# Patient Record
Sex: Male | Born: 1955 | Hispanic: No | Marital: Married
Health system: Southern US, Community
[De-identification: ages and names within clinical notes are randomized; demographics above are authoritative.]

---

## 2006-12-18 ENCOUNTER — Other Ambulatory Visit: Payer: Self-pay

## 2006-12-18 ENCOUNTER — Inpatient Hospital Stay: Payer: Self-pay | Admitting: Internal Medicine

## 2006-12-29 ENCOUNTER — Ambulatory Visit: Payer: Self-pay | Admitting: Internal Medicine

## 2006-12-31 ENCOUNTER — Other Ambulatory Visit: Payer: Self-pay

## 2006-12-31 ENCOUNTER — Inpatient Hospital Stay: Payer: Self-pay | Admitting: Internal Medicine

## 2007-01-19 ENCOUNTER — Ambulatory Visit: Payer: Self-pay | Admitting: Internal Medicine

## 2007-01-27 ENCOUNTER — Other Ambulatory Visit: Payer: Self-pay

## 2007-01-27 ENCOUNTER — Inpatient Hospital Stay: Payer: Self-pay | Admitting: Internal Medicine

## 2007-01-29 ENCOUNTER — Ambulatory Visit: Payer: Self-pay | Admitting: Internal Medicine

## 2007-02-01 ENCOUNTER — Ambulatory Visit: Payer: Self-pay | Admitting: Internal Medicine

## 2007-02-09 ENCOUNTER — Inpatient Hospital Stay: Payer: Self-pay | Admitting: Internal Medicine

## 2007-02-09 ENCOUNTER — Other Ambulatory Visit: Payer: Self-pay

## 2007-02-13 ENCOUNTER — Other Ambulatory Visit: Payer: Self-pay

## 2007-02-14 ENCOUNTER — Other Ambulatory Visit: Payer: Self-pay

## 2007-02-28 ENCOUNTER — Ambulatory Visit: Payer: Self-pay | Admitting: Internal Medicine

## 2007-03-14 ENCOUNTER — Inpatient Hospital Stay: Payer: Self-pay | Admitting: Internal Medicine

## 2007-03-14 ENCOUNTER — Other Ambulatory Visit: Payer: Self-pay

## 2007-03-31 ENCOUNTER — Ambulatory Visit: Payer: Self-pay | Admitting: Internal Medicine

## 2007-04-05 ENCOUNTER — Ambulatory Visit: Payer: Self-pay | Admitting: Internal Medicine

## 2007-05-01 ENCOUNTER — Ambulatory Visit: Payer: Self-pay | Admitting: Internal Medicine

## 2007-05-29 ENCOUNTER — Ambulatory Visit: Payer: Self-pay | Admitting: Internal Medicine

## 2007-06-29 ENCOUNTER — Ambulatory Visit: Payer: Self-pay | Admitting: Internal Medicine

## 2007-07-29 ENCOUNTER — Ambulatory Visit: Payer: Self-pay | Admitting: Internal Medicine

## 2007-08-18 ENCOUNTER — Ambulatory Visit: Payer: Self-pay | Admitting: Internal Medicine

## 2007-08-29 ENCOUNTER — Ambulatory Visit: Payer: Self-pay | Admitting: Internal Medicine

## 2007-09-28 ENCOUNTER — Ambulatory Visit: Payer: Self-pay | Admitting: Internal Medicine

## 2007-10-20 ENCOUNTER — Ambulatory Visit: Payer: Self-pay | Admitting: Internal Medicine

## 2007-10-29 ENCOUNTER — Ambulatory Visit: Payer: Self-pay | Admitting: Internal Medicine

## 2007-11-29 ENCOUNTER — Ambulatory Visit: Payer: Self-pay | Admitting: Internal Medicine

## 2007-12-13 ENCOUNTER — Ambulatory Visit: Payer: Self-pay | Admitting: Internal Medicine

## 2007-12-29 ENCOUNTER — Ambulatory Visit: Payer: Self-pay | Admitting: Internal Medicine

## 2008-01-21 ENCOUNTER — Inpatient Hospital Stay: Payer: Self-pay | Admitting: Internal Medicine

## 2008-02-08 ENCOUNTER — Ambulatory Visit (HOSPITAL_COMMUNITY): Admission: RE | Admit: 2008-02-08 | Discharge: 2008-02-08 | Payer: Self-pay | Admitting: Internal Medicine

## 2008-06-14 ENCOUNTER — Emergency Department: Payer: Self-pay | Admitting: Unknown Physician Specialty

## 2009-05-15 ENCOUNTER — Emergency Department: Payer: Self-pay | Admitting: Internal Medicine

## 2009-08-11 ENCOUNTER — Inpatient Hospital Stay: Payer: Self-pay | Admitting: Specialist

## 2009-08-12 ENCOUNTER — Ambulatory Visit: Payer: Self-pay | Admitting: Cardiovascular Disease

## 2009-12-21 IMAGING — CR DG CHEST 1V PORT
1 series · 1 of 1 positions shown · non-contrast
Comparison: none

REASON FOR EXAM: central line placement     rm 3
COMMENTS:

[view not recorded]
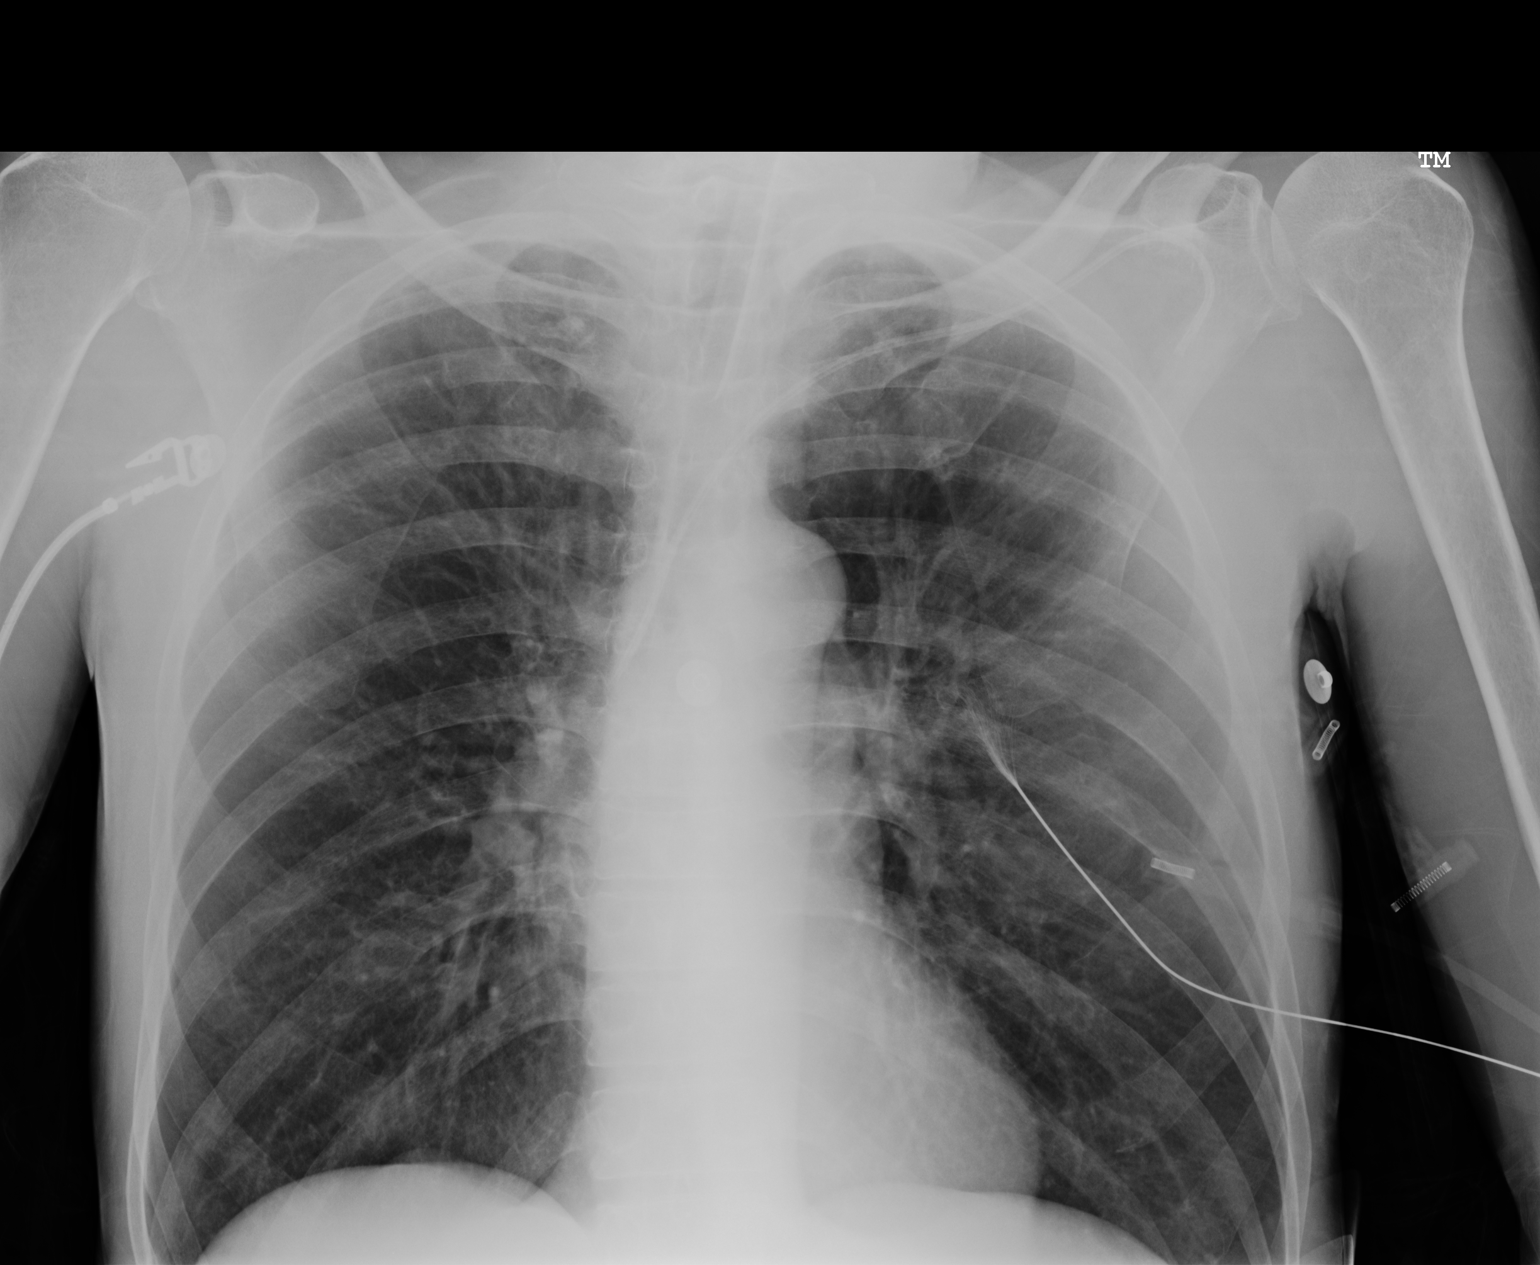

[1 of 1 positions shown; findings below may reference images not displayed]

PROCEDURE:     DXR - DXR PORTABLE CHEST SINGLE VIEW  - January 21, 2008  [DATE]

RESULT:     Comparison is made to a previous examination of 01/21/2008.

Since the previous examination a LEFT subclavian central venous catheter has
been placed. The tip is in the superior vena cava and appears to be in good
position. There is no evidence of pneumothorax. The endotracheal tube tip is
at the level of the sternoclavicular joints. There is no significant
interval change otherwise.
IMPRESSION: 1.     Interval placement of a LEFT subclavian central venous catheter
without evidence of complication.

## 2009-12-21 IMAGING — CR DG CHEST 1V PORT
1 series · 1 of 1 positions shown · non-contrast
Comparison: none

REASON FOR EXAM: tube placement       rm 3
COMMENTS:

[view not recorded]
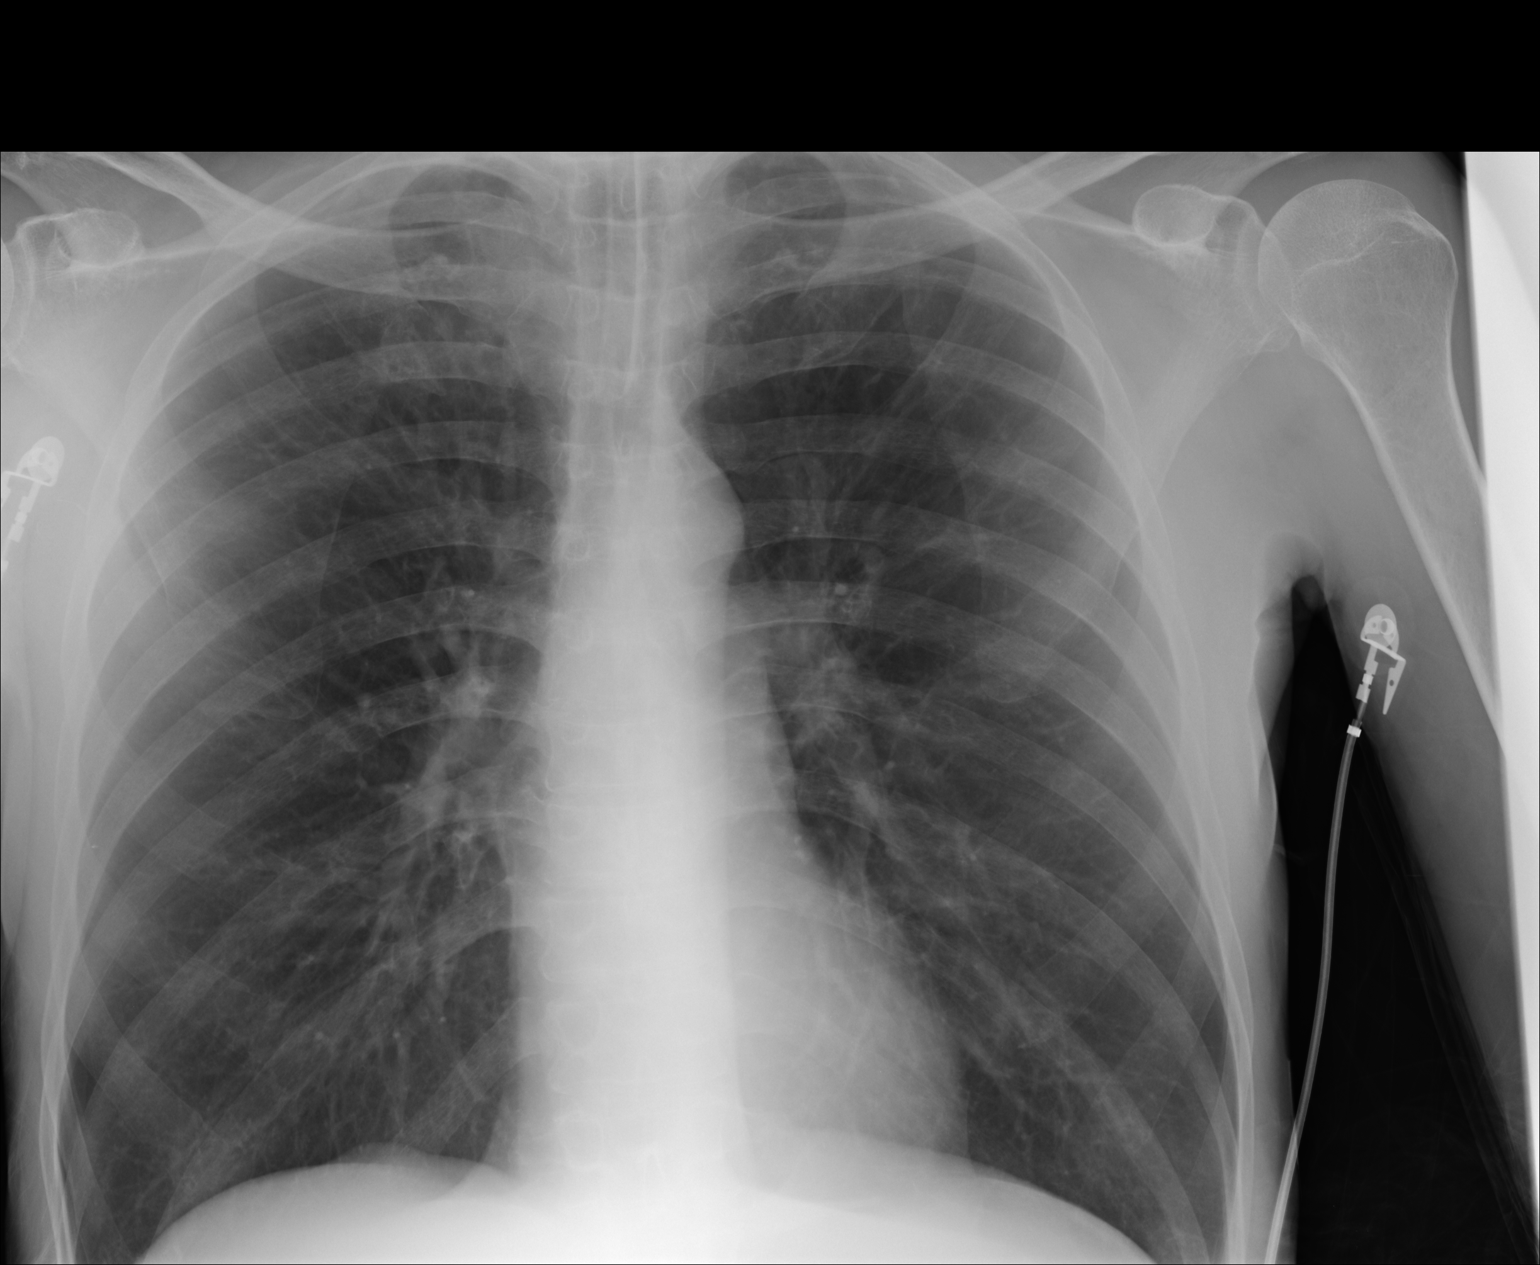

[1 of 1 positions shown; findings below may reference images not displayed]

PROCEDURE:     DXR - DXR PORTABLE CHEST SINGLE VIEW  - January 21, 2008  [DATE]

RESULT:     Comparison is made to the earlier film of 01/21/2008 at [DATE]. The patient has been intubated. The tip of the endotracheal tube appears
to be at the level of the aortic arch and origin of the great vessels. The
lungs are hyperinflated consistent with COPD. There is no infiltrate or
effusion. There is no pneumothorax. The costophrenic angles are not included
on the film.
IMPRESSION: Endotracheal tube appears to be in good position. COPD
changes are noted.

## 2009-12-21 IMAGING — CR DG CHEST 1V PORT
1 series · 1 of 1 positions shown · non-contrast
Comparison: none

REASON FOR EXAM: reposition of central line       rm 3
COMMENTS:

[view not recorded]
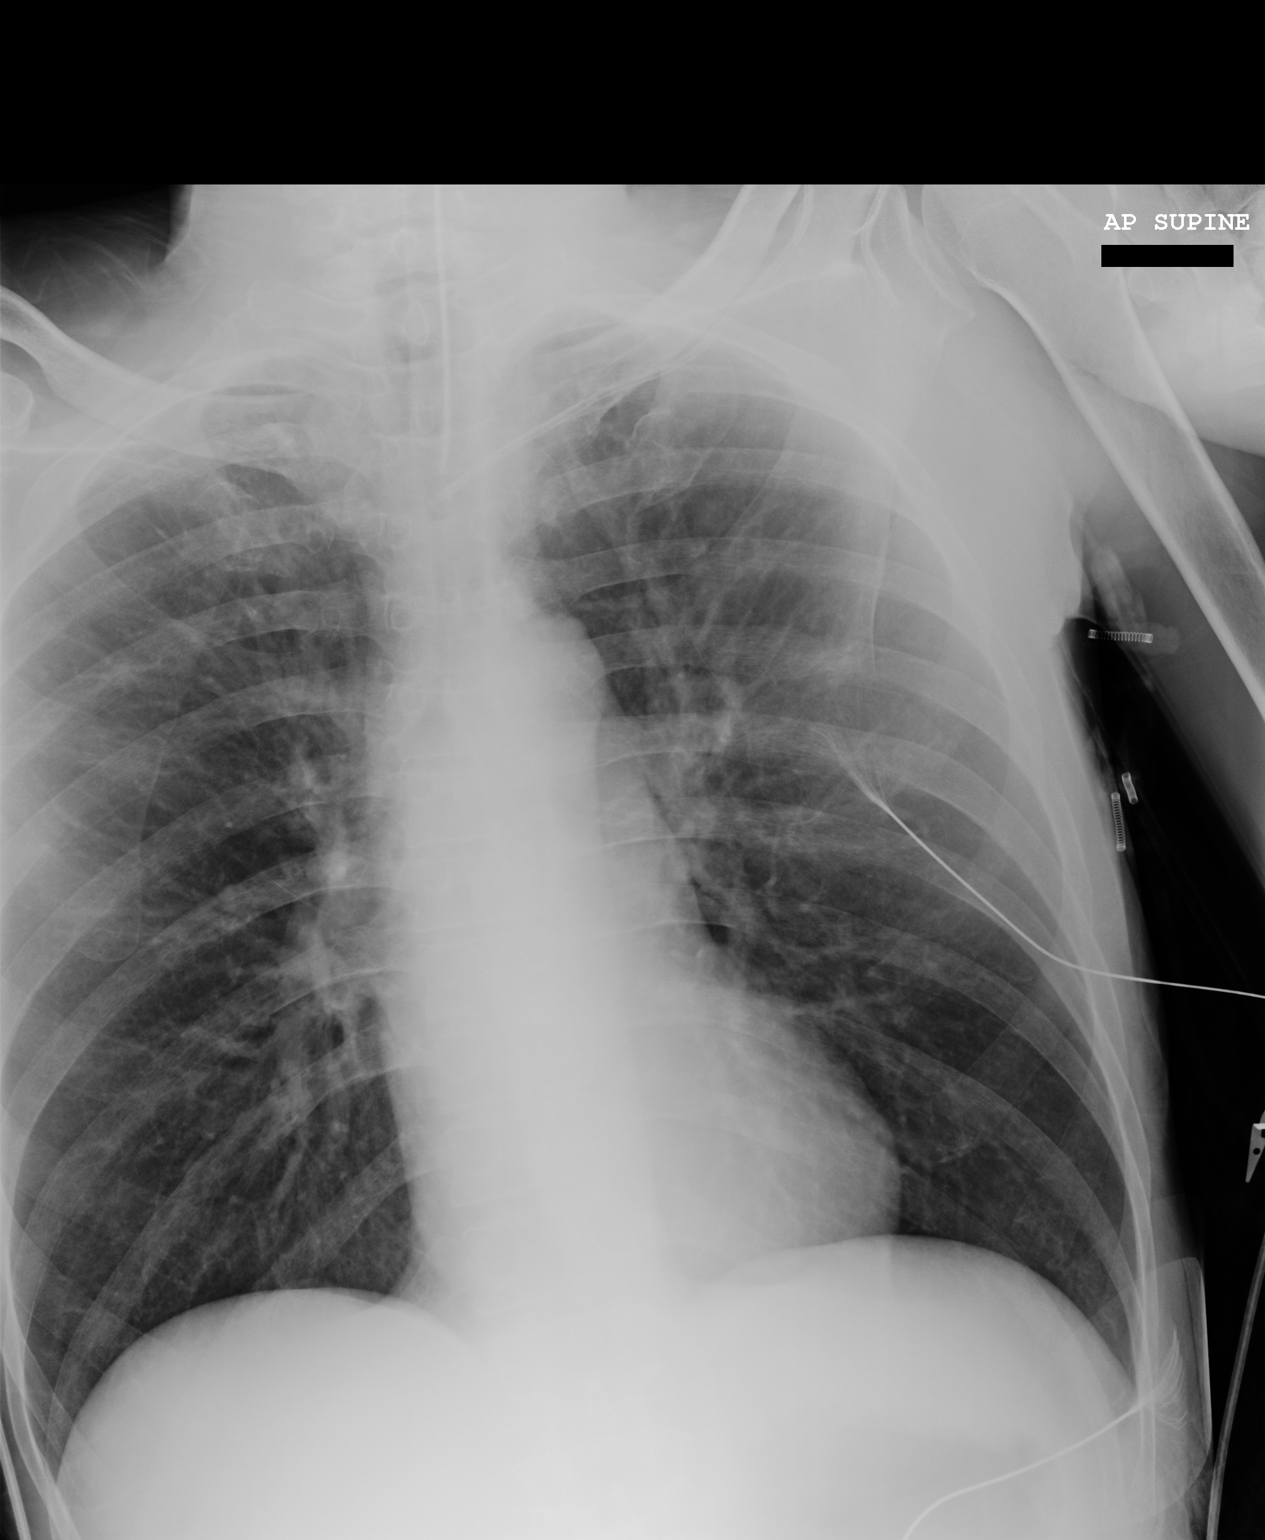

[1 of 1 positions shown; findings below may reference images not displayed]

PROCEDURE:     DXR - DXR PORTABLE CHEST SINGLE VIEW  - January 21, 2008  [DATE]

RESULT:     Comparison is made to the earlier studies of the same date. A
LEFT subclavian central venous catheter is present. The tip has been
withdrawn slightly into the LEFT brachiocephalic region. The endotracheal
tube tip remains unchanged at the level of the sternoclavicular joints. No
other significant change is evident. COPD is present.
IMPRESSION: Please see above.

## 2009-12-21 IMAGING — CT CT HEAD WITHOUT CONTRAST
2 series · 16 of 30 positions shown, 20 images · non-contrast
Comparison: none

REASON FOR EXAM: dec mental status poss seizure
COMMENTS:

[Series 2: without · axial · non-contrast · 0.48mm/px · z∈[-162,-28]mm · 13 of 33 slices shown, 17 images]
[im 3/33  brain]
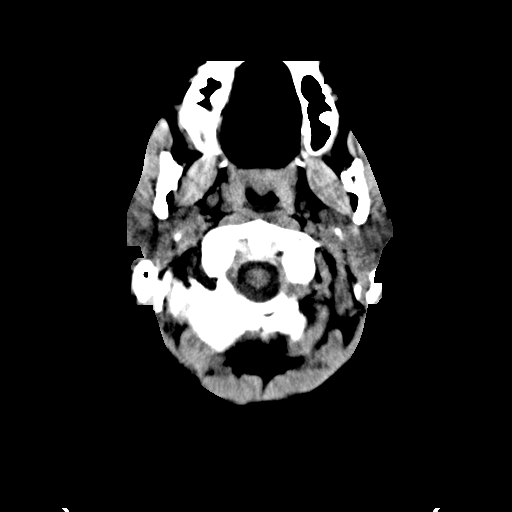
[im 3/33  bone]
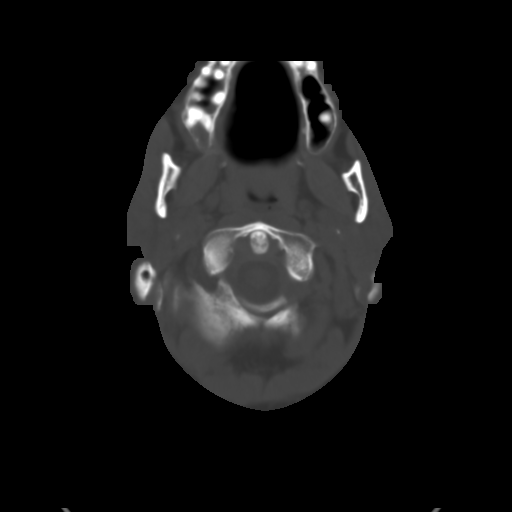
[im 5/33  brain]
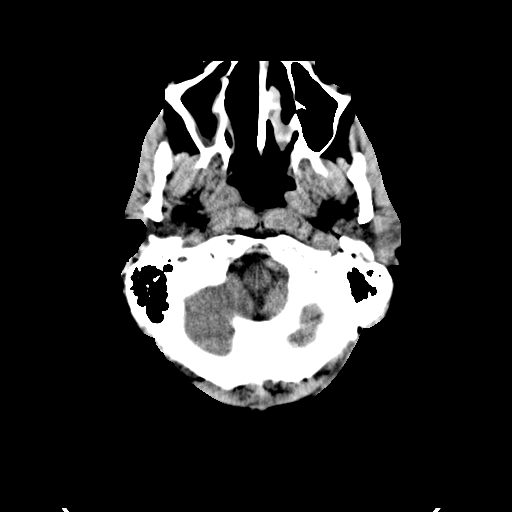
[im 7/33  brain]
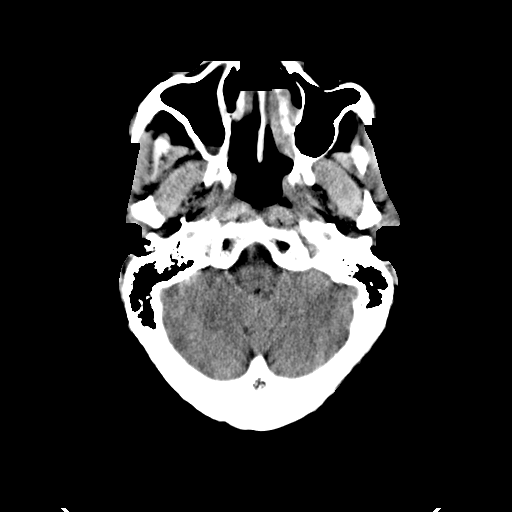
[im 10/33  brain]
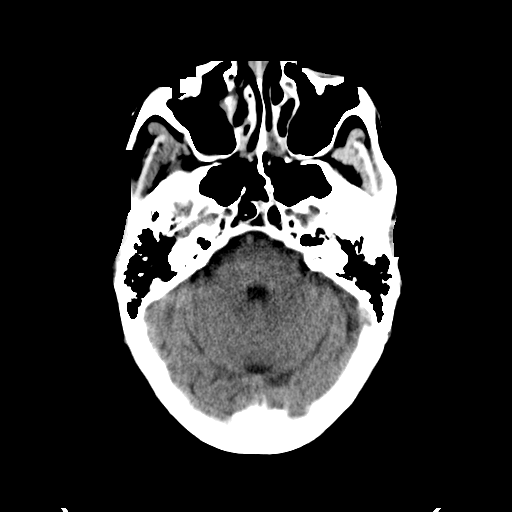
[im 12/33  brain]
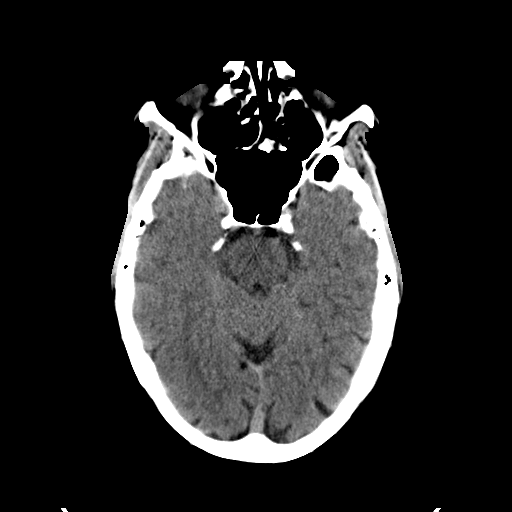
[im 12/33  bone]
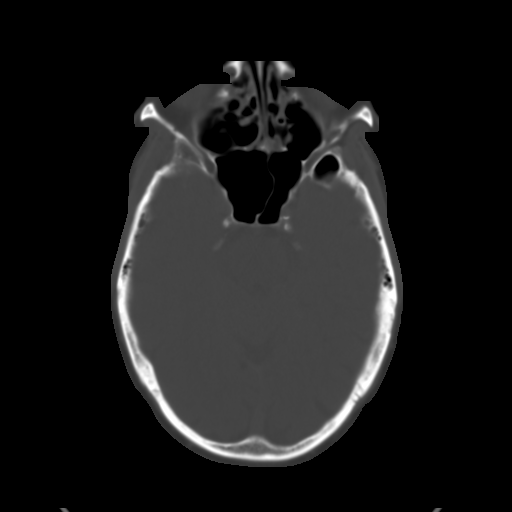
[im 14/33  brain]
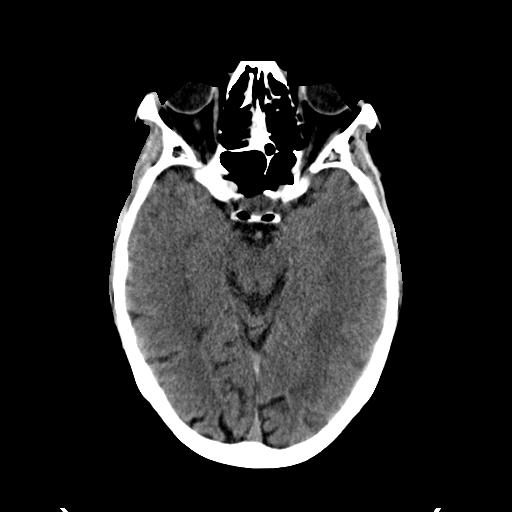
[im 17/33  brain]
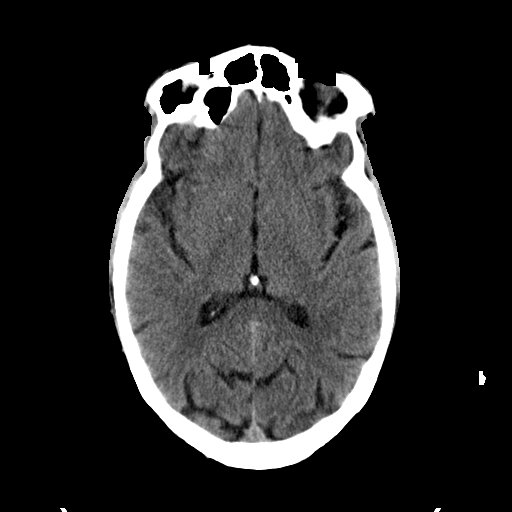
[im 19/33  brain]
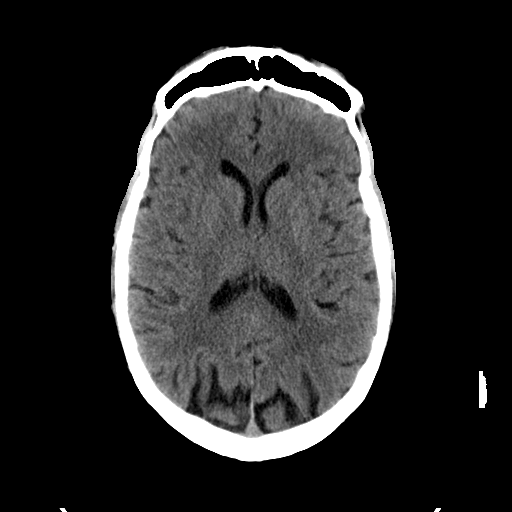
[im 21/33  brain]
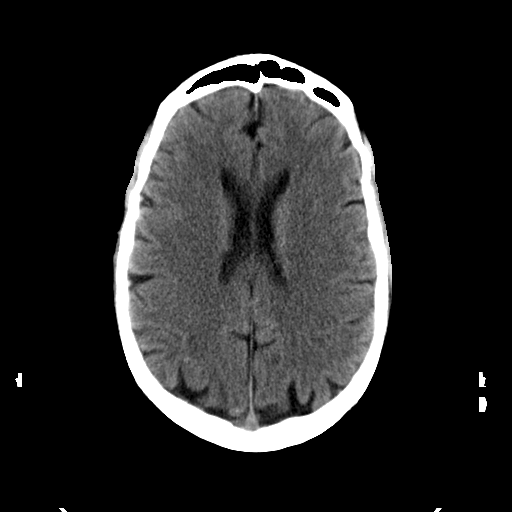
[im 21/33  bone]
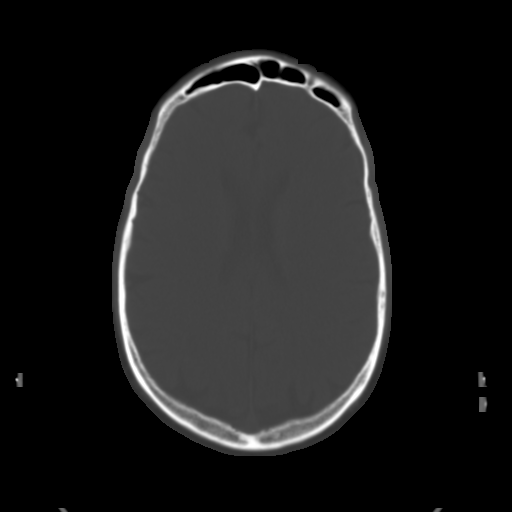
[im 23/33  brain]
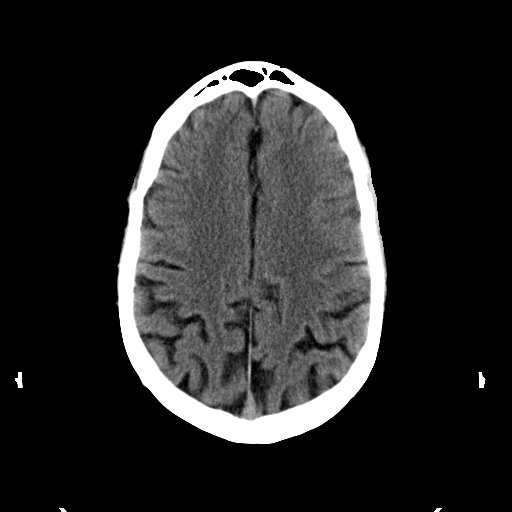
[im 26/33  brain]
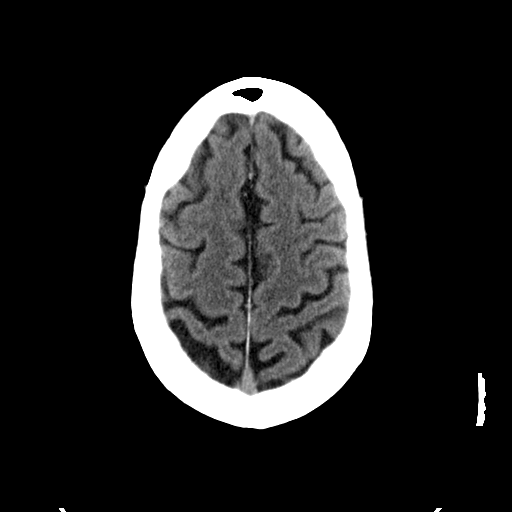
[im 28/33  brain]
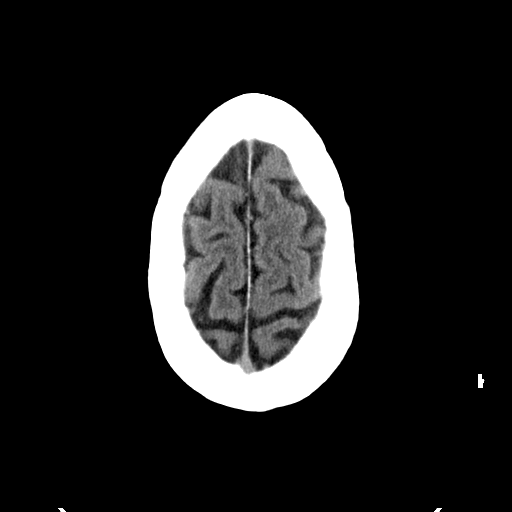
[im 30/33  brain]
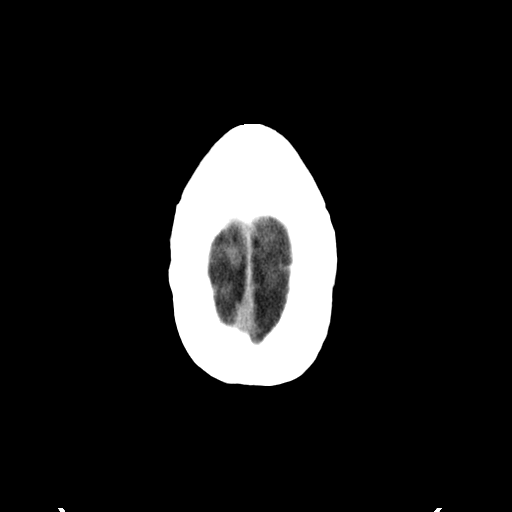
[im 30/33  bone]
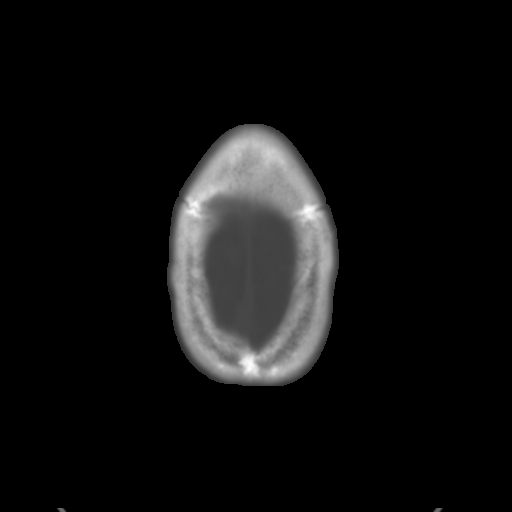

[Series 3: bone · axial · 0.48mm/px · z∈[-162,-118]mm · 3 of 33 slices shown]
[im 3/33  bone]
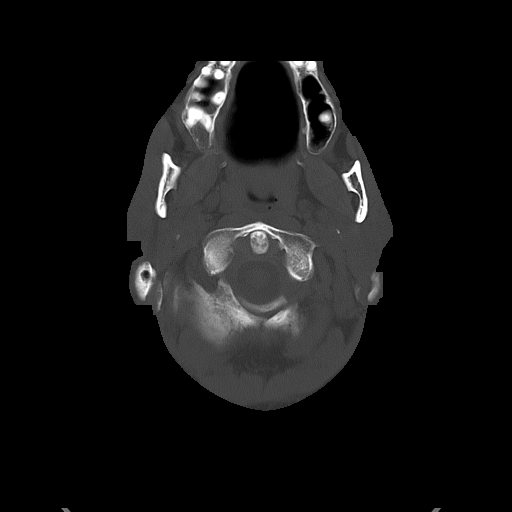
[im 7/33  bone]
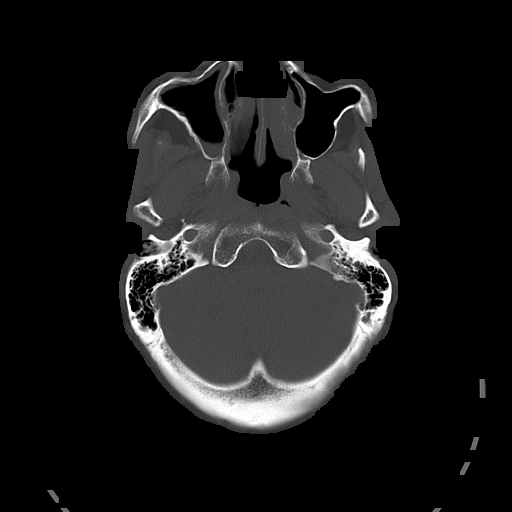
[im 12/33  bone]
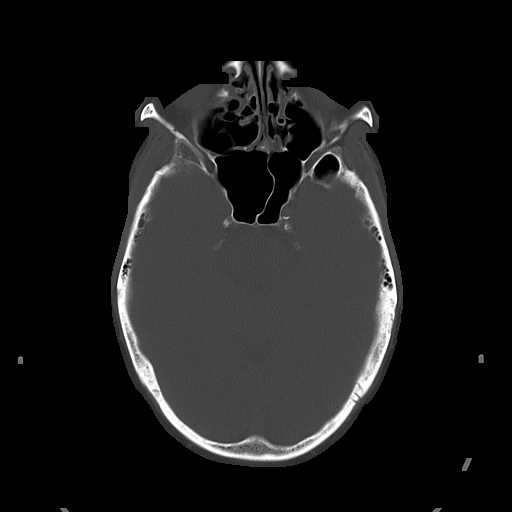

[16 of 30 positions shown; findings below may reference images not displayed]

PROCEDURE:     CT  - CT HEAD WITHOUT CONTRAST  - January 21, 2008  [DATE]

RESULT:     Non-contrast emergent CT is compared to a previous examination
of 02/09/2007.

There is mild atrophy with prominence of the ventricles and sulci. There is
no hemorrhage, mass-effect or midline shift. There is no extra-axial
hematoma. There is no large territorial infarct. An endotracheal tube
appears to be present. There is an air-fluid level in the RIGHT maxillary
sinus. There is some mucosal thickening in the RIGHT maxillary sinus as well
as in the ethmoid sinuses. The calvarium is intact.
IMPRESSION: 1.     No significant change. Mild atrophy. No hemorrhage or mass-effect.

## 2010-10-19 ENCOUNTER — Inpatient Hospital Stay: Payer: Self-pay | Admitting: Student

## 2010-12-29 ENCOUNTER — Ambulatory Visit: Payer: Self-pay | Admitting: Internal Medicine

## 2011-01-13 ENCOUNTER — Inpatient Hospital Stay: Payer: Self-pay | Admitting: Student

## 2011-01-29 ENCOUNTER — Ambulatory Visit: Payer: Self-pay | Admitting: Internal Medicine

## 2011-03-09 ENCOUNTER — Inpatient Hospital Stay: Payer: Self-pay | Admitting: Internal Medicine

## 2011-03-16 ENCOUNTER — Ambulatory Visit (HOSPITAL_COMMUNITY)
Admission: AD | Admit: 2011-03-16 | Discharge: 2011-03-16 | Disposition: A | Discharge status: Home / Self Care | Payer: Self-pay | Source: Other Acute Inpatient Hospital | Attending: Internal Medicine | Admitting: Internal Medicine

## 2011-03-16 DIAGNOSIS — J9600 Acute respiratory failure, unspecified whether with hypoxia or hypercapnia: Secondary | ICD-10-CM | POA: Insufficient documentation

## 2011-05-01 DEATH — deceased

## 2014-07-22 NOTE — Consult Note (Signed)
PATIENT NAME:  Thomas RubensteinOTEAT, Thomas Stanton MR#:  [id number] DATE OF BIRTH:  1955/06/11  DATE OF CONSULTATION:  03/11/2011  REFERRING PHYSICIAN:   CONSULTING PHYSICIAN:  Adella HareJ. Wilton Smith, MD  HISTORY OF PRESENT ILLNESS: This 59 year old male has been referred for surgical consultation regarding respiratory failure and need for insertion of gastrostomy tube for nutritional support. He has a history of chronic obstructive pulmonary disease and has had numerous episodes of respiratory failure in the past. He has been on ventilator management in the past. He has had a temporary tracheostomy in the past and has been admitted with respiratory failure requiring endotracheal intubation and placement on a ventilator and now being referred for insertion of gastrostomy tube for long-term nutritional support.   PAST MEDICAL HISTORY:  1. Hypertension. 2. Immunodeficiency virus. 3. History of right hip fracture.   MEDICATIONS:  1. Albuterol 1 puff every six hours p.r.n.  2. Amlodipine 10 mg half tablet daily. 3. Asmanex 120 220 mcg daily.  4. Recent azithromycin 600 mg 2 tablets once a week. 5. Calcium 600 Plus D tablet daily.  6. Codeine 1 tablet 2 times per day. 7. Dapsone 100 mg daily.  8. Lamivudine 150 mg once a day.  9. Multiple vitamins.  10. Naprosyn 375 mg 1 tablet 2 times a day.  11. Norvir 100 mg 2 times per day.  12. Prednisone 5 mg tablets 2 tablets daily. 13. Prezista 600 mg 2 times per day. 14. Raltegravir 400 mg 1 tablet 2 times per day. 15. Ritonavir 100 mg 2 times per day. 16. Spiriva 18 mcg inhaled daily.    I reviewed the medicines which he is receiving in the hospital which include:  1. Albuterol. 2. Fluticasone. 3. Subcutaneous heparin which is 5000 units sub-Q q.8 hours. 4. Levofloxacin 750 mg IV q.24 hours. 5. Solu-Medrol 60 mg q.8 hours. 6. Pantoprazole 40 mg IV daily.  7. Pro-Stat 64 1 package orogastric tube each day.  8. Fentanyl drip. 9. Midazolam drip.    10. Norepinephrine drip.   His current tube feeding includes Vital 1.5 calories/mL. This is going at 15 mL/h and also the above-mentioned Pro-Stat.   ALLERGIES: None known.   SOCIAL HISTORY: Does not smoke. There is a history of alcoholism and drug abuse.   FAMILY HISTORY: His father died a few days ago and was buried yesterday.  REVIEW OF SYSTEMS: He is currently unconscious secondary to encephalopathy and sedation and unable to provide review of systems. It is, however, noted that according to the nurse he has been moving his bowels and also is having satisfactory urinary output. Otherwise, he has been inactive confined to bed on the ventilator and sedated.   PHYSICAL EXAMINATION:  GENERAL: As noted, he is unconscious and sedated and on a ventilator in a CCU bed.  VITAL SIGNS: Blood pressure 111/65, oxygen saturation 92%, respiratory rate 20, pulse rate 64, temperature 96.2.   SKIN: Warm and dry. There is much scaling of the skin of the lower extremities and feet. There is no rash.   HEENT: Pupils equal and reactive to light and are about 2 mm. Sclerae clear. Mouth contains orogastric tube and also endotracheal tube. There is some scant mucus production.  NECK: No palpable mass. There is an old scar subsequent to previous tracheostomy. He does have an indwelling triple-lumen catheter in the right side of his neck.   LUNGS: His lung sounds are clear.   HEART: Regular rhythm, S1, S2 without murmur.   ABDOMEN: Abdomen appears to  be somewhat asthenic, soft, and with no palpable mass. No significant tympany.    EXTREMITIES: Does have some mild cogwheel rigidity of both upper extremities but the patient is not having any voluntary movement.  NEUROLOGIC: He is unconscious and does not arouse to hearing his name called. As noted above, he has the upper extremity cogwheel rigidity.   CLINICAL DATA: Latest glucose 133, BUN 37, creatinine 1.31, albumin 3.0. Liver panel with elevated SGOT  of 45. His urine drug screen was positive for benzodiazepines. White blood count 4800, hemoglobin 9.3, platelet count 146,000. Chest x-ray demonstrates presence of endotracheal central, the venous catheter. No pulmonary infiltrates identified.   Urine output measured at 1800 was 500 mL which appears to be from 5 a.m. until 18:00.    IMPRESSION:  1. Respiratory failure.  2. Dysphagia secondary to respiratory failure, orotracheal intubation.  Dr. Marva Panda has already discussed with the family the insertion of the gastric feeding tube and the percutaneous endoscopic gastrostomy technique with risks and benefits and the family has already signed the consent form.   I did discuss the case with Dr. Marva Panda and will plan to do the PEG tomorrow morning at the bedside.       Also plan Kefzol 1 gram IV piggyback 15 minutes before the procedure and also will hold his heparin at 2 a.m. and also will hold the Vital.  ____________________________ J. Renda Rolls, MD [email or web address] D: 03/11/2011 18:40:19 ET T: 03/12/2011 09:35:19 ET JOB#: [id number]  cc: Adella Hare, MD, <Dictator> Adella Hare MD ELECTRONICALLY SIGNED 04/04/2011 17:35

## 2014-07-22 NOTE — Op Note (Signed)
PATIENT NAME:  Thomas RubensteinOTEAT, Carron W MR#:  [id number] DATE OF BIRTH:  March 18, 1956  DATE OF PROCEDURE:  03/12/2011  PREOPERATIVE DIAGNOSIS: Respiratory failure and secondary dysphagia.   POSTOPERATIVE DIAGNOSIS: Respiratory failure and secondary dysphagia.   PROCEDURE: Percutaneous endoscopic gastrostomy.   SURGEON: Renda RollsWilton Rishith Siddoway, M.D.   GASTROENTEROLOGIST: Barnetta ChapelMartin Skulskie, M.D.   ANESTHESIA: Intravenous sedation.   INDICATIONS: This 59 year old male with a history of chronic obstructive pulmonary disease on home oxygen came in with acute respiratory failure. He had had a previous tracheostomy and it appeared that he would likely be on a ventilator for a long period of time and percutaneous endoscopic gastrostomy was recommended for nutritional support.   DESCRIPTION OF PROCEDURE: The patient was in his critical care unit bed on a ventilator and vital signs were stable. Dr. Marva PandaSkulskie began the procedure with the endoscopy and he will create a separate note describing his findings.   After initial survey of the stomach, the patient was placed in the supine position with the head of the bed elevated at approximately 15 degrees. The upper abdomen was examined and could see the light of the endoscope shining through the abdominal wall in the left upper quadrant. A site was selected approximately 3 cm to the left of the midline and 3 cm below the coastal margin at a point that would be the best place to insert the gastrostomy tube. This site was prepared with alcohol, subsequently with Betadine, and draped in a sterile manner. The skin was infiltrated with 1% Xylocaine. Four Brown-Mueller T-fasteners were inserted and traction was applied as cotton balls were advanced over the suture material and this was held with crimps for four-point fixation of the stomach to the abdominal wall. Next a transversely-oriented 8-mm incision was made. A Seldinger needle was inserted into the stomach midway between the four  T-fasteners. A guidewire was advanced through the needle and the needle withdrawn. The tract was serially dilated and ultimately inserted an 18-gauge magna port feeding catheter. The balloon was inflated with 15 mL of water. Traction was applied as the accompanying disk was advanced down to the abdominal wall. Carbon dioxide was allowed to escape from the stomach as Dr. Marva PandaSkulskie removed the endoscope. Next, the disk was sutured to the skin with 2-0 silk four-point fixation. Dressings were applied with 4 x 4 gauze and paper tape. The tube was attached to a biliary drainage bag.   The patient tolerated this satisfactorily and appeared to remain in stable condition.    ____________________________ Shela CommonsJ. Renda RollsWilton Sherian Valenza, MD [email or web address] D: 03/12/2011 12:19:49 ET T: 03/12/2011 12:58:28 ET JOB#: [id number]  cc: Adella HareJ. Wilton Nene Aranas, MD, <Dictator> Adella HareWILTON J Charee Tumblin MD ELECTRONICALLY SIGNED 04/04/2011 17:35
